# Patient Record
Sex: Male | Born: 2000 | Race: Black or African American | Hispanic: No | Marital: Single
Health system: Southern US, Community
[De-identification: ages and names within clinical notes are randomized; demographics above are authoritative.]

---

## 2021-02-13 ENCOUNTER — Emergency Department (HOSPITAL_COMMUNITY): Payer: No Typology Code available for payment source

## 2021-02-13 ENCOUNTER — Emergency Department (HOSPITAL_COMMUNITY)
Admission: EM | Admit: 2021-02-13 | Discharge: 2021-02-14 | Disposition: A | Discharge status: Home / Self Care | Payer: No Typology Code available for payment source | Attending: Emergency Medicine | Admitting: Emergency Medicine

## 2021-02-13 DIAGNOSIS — R0781 Pleurodynia: Secondary | ICD-10-CM | POA: Insufficient documentation

## 2021-02-13 DIAGNOSIS — R109 Unspecified abdominal pain: Secondary | ICD-10-CM | POA: Insufficient documentation

## 2021-02-13 DIAGNOSIS — S50311A Abrasion of right elbow, initial encounter: Secondary | ICD-10-CM | POA: Diagnosis not present

## 2021-02-13 DIAGNOSIS — Y92410 Unspecified street and highway as the place of occurrence of the external cause: Secondary | ICD-10-CM | POA: Diagnosis not present

## 2021-02-13 DIAGNOSIS — S60812A Abrasion of left wrist, initial encounter: Secondary | ICD-10-CM | POA: Insufficient documentation

## 2021-02-13 DIAGNOSIS — S59901A Unspecified injury of right elbow, initial encounter: Secondary | ICD-10-CM | POA: Diagnosis present

## 2021-02-13 DIAGNOSIS — T07XXXA Unspecified multiple injuries, initial encounter: Secondary | ICD-10-CM

## 2021-02-13 DIAGNOSIS — R110 Nausea: Secondary | ICD-10-CM | POA: Diagnosis not present

## 2021-02-13 DIAGNOSIS — Z23 Encounter for immunization: Secondary | ICD-10-CM | POA: Diagnosis not present

## 2021-02-13 DIAGNOSIS — S0990XA Unspecified injury of head, initial encounter: Secondary | ICD-10-CM | POA: Insufficient documentation

## 2021-02-13 DIAGNOSIS — T1490XA Injury, unspecified, initial encounter: Secondary | ICD-10-CM

## 2021-02-13 DIAGNOSIS — S80211A Abrasion, right knee, initial encounter: Secondary | ICD-10-CM | POA: Insufficient documentation

## 2021-02-13 DIAGNOSIS — S60512A Abrasion of left hand, initial encounter: Secondary | ICD-10-CM | POA: Insufficient documentation

## 2021-02-13 DIAGNOSIS — S50811A Abrasion of right forearm, initial encounter: Secondary | ICD-10-CM | POA: Insufficient documentation

## 2021-02-13 LAB — COMPREHENSIVE METABOLIC PANEL
ALT: 28 U/L (ref 0–44)
AST: 16 U/L (ref 15–41)
Albumin: 3.8 g/dL (ref 3.5–5.0)
Alkaline Phosphatase: 47 U/L (ref 38–126)
Anion gap: 7 (ref 5–15)
BUN: 17 mg/dL (ref 6–20)
CO2: 27 mmol/L (ref 22–32)
Calcium: 9.1 mg/dL (ref 8.9–10.3)
Chloride: 105 mmol/L (ref 98–111)
Creatinine, Ser: 0.98 mg/dL (ref 0.61–1.24)
GFR, Estimated: >60 mL/min (ref >60)
Glucose, Bld: 109 mg/dL — ABNORMAL HIGH (ref 70–99)
Potassium: 4.5 mmol/L (ref 3.5–5.1)
Sodium: 139 mmol/L (ref 135–145)
Total Bilirubin: 0.5 mg/dL (ref 0.3–1.2)
Total Protein: 6.9 g/dL (ref 6.5–8.1)

## 2021-02-13 LAB — CBC WITH DIFFERENTIAL/PLATELET
Abs Immature Granulocytes: 0.05 10*3/uL (ref 0.00–0.07)
Basophils Absolute: 0 10*3/uL (ref 0.0–0.1)
Basophils Relative: 0 %
Eosinophils Absolute: 0 10*3/uL (ref 0.0–0.5)
Eosinophils Relative: 0 %
HCT: 42.7 % (ref 39.0–52.0)
Hemoglobin: 14 g/dL (ref 13.0–17.0)
Immature Granulocytes: 0 %
Lymphocytes Relative: 13 %
Lymphs Abs: 1.8 10*3/uL (ref 0.7–4.0)
MCH: 29.6 pg (ref 26.0–34.0)
MCHC: 32.8 g/dL (ref 30.0–36.0)
MCV: 90.3 fL (ref 80.0–100.0)
Monocytes Absolute: 0.7 10*3/uL (ref 0.1–1.0)
Monocytes Relative: 5 %
Neutro Abs: 11.1 10*3/uL — ABNORMAL HIGH (ref 1.7–7.7)
Neutrophils Relative %: 82 %
Platelets: 338 10*3/uL (ref 150–400)
RBC: 4.73 MIL/uL (ref 4.22–5.81)
RDW: 13.6 % (ref 11.5–15.5)
WBC: 13.7 10*3/uL — ABNORMAL HIGH (ref 4.0–10.5)
nRBC: 0 % (ref 0.0–0.2)

## 2021-02-13 MED ORDER — ONDANSETRON HCL 4 MG/2ML IJ SOLN
4.0000 mg | Freq: Once | INTRAMUSCULAR | Status: AC
  Administered 2021-02-13: 4 mg via INTRAVENOUS
  Filled 2021-02-13: qty 2

## 2021-02-13 MED ORDER — IOHEXOL 300 MG/ML  SOLN
100.0000 mL | Freq: Once | INTRAMUSCULAR | Status: AC | PRN
  Administered 2021-02-13: 100 mL via INTRAVENOUS

## 2021-02-13 MED ORDER — MORPHINE SULFATE (PF) 4 MG/ML IV SOLN
4.0000 mg | Freq: Once | INTRAVENOUS | Status: AC
  Administered 2021-02-13: 4 mg via INTRAVENOUS
  Filled 2021-02-13: qty 1

## 2021-02-13 MED ORDER — TETANUS-DIPHTH-ACELL PERTUSSIS 5-2.5-18.5 LF-MCG/0.5 IM SUSY
0.5000 mL | PREFILLED_SYRINGE | Freq: Once | INTRAMUSCULAR | Status: AC
  Administered 2021-02-13: 0.5 mL via INTRAMUSCULAR
  Filled 2021-02-13: qty 0.5

## 2021-02-13 MED ORDER — BACITRACIN ZINC 500 UNIT/GM EX OINT
1.0000 "application " | TOPICAL_OINTMENT | Freq: Two times a day (BID) | CUTANEOUS | Status: DC
  Filled 2021-02-13: qty 0.9
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
## 2021-02-14 MED ORDER — HYDROCODONE-ACETAMINOPHEN 5-325 MG PO TABS
1.0000 | ORAL_TABLET | Freq: Once | ORAL | Status: AC
  Administered 2021-02-14: 1 via ORAL
  Filled 2021-02-14: qty 1

## 2021-02-14 MED ORDER — HYDROCODONE-ACETAMINOPHEN 5-325 MG PO TABS: 1.0000 | ORAL_TABLET | ORAL | 0 refills | Status: AC | PRN
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
## 2021-02-14 NOTE — Discharge Instructions (Addendum)
You were evaluated in the Emergency Department and after careful evaluation, we did not find any emergent condition requiring admission or further testing in the hospital.  Your exam/testing today was overall reassuring.  Recommend Tylenol and Motrin at home for discomfort, recommend daily dressing changes of your wounds.  Please return to the Emergency Department if you experience any worsening of your condition.  Thank you for allowing [place] to be a part of your care.

## 2021-02-15 ENCOUNTER — Telehealth: Payer: Self-pay

## 2021-02-15 NOTE — Telephone Encounter (Signed)
Mother left message on this CM phone that she needs a note for her son for work. Called number she gave back left confidential VM
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
